# Patient Record
Sex: Female | Born: 1985 | Hispanic: Yes | Marital: Married | Smoking: Current some day smoker
Health system: Southern US, Community
[De-identification: ages and names within clinical notes are randomized; demographics above are authoritative.]

## PROBLEM LIST (undated history)

## (undated) HISTORY — PX: HAND SURGERY: SHX662

---

## 2016-12-15 ENCOUNTER — Encounter (HOSPITAL_COMMUNITY): Payer: Self-pay | Admitting: Emergency Medicine

## 2016-12-15 ENCOUNTER — Emergency Department (HOSPITAL_COMMUNITY)
Admission: EM | Admit: 2016-12-15 | Discharge: 2016-12-15 | Disposition: A | Discharge status: Home / Self Care | Payer: Self-pay | Attending: Emergency Medicine | Admitting: Emergency Medicine

## 2016-12-15 DIAGNOSIS — X58XXXA Exposure to other specified factors, initial encounter: Secondary | ICD-10-CM | POA: Insufficient documentation

## 2016-12-15 DIAGNOSIS — Y929 Unspecified place or not applicable: Secondary | ICD-10-CM | POA: Insufficient documentation

## 2016-12-15 DIAGNOSIS — Y939 Activity, unspecified: Secondary | ICD-10-CM | POA: Insufficient documentation

## 2016-12-15 DIAGNOSIS — K029 Dental caries, unspecified: Secondary | ICD-10-CM | POA: Insufficient documentation

## 2016-12-15 DIAGNOSIS — Y999 Unspecified external cause status: Secondary | ICD-10-CM | POA: Insufficient documentation

## 2016-12-15 DIAGNOSIS — S025XXA Fracture of tooth (traumatic), initial encounter for closed fracture: Secondary | ICD-10-CM | POA: Insufficient documentation

## 2016-12-15 DIAGNOSIS — K0889 Other specified disorders of teeth and supporting structures: Secondary | ICD-10-CM | POA: Insufficient documentation

## 2016-12-15 MED ORDER — OXYCODONE-ACETAMINOPHEN 5-325 MG PO TABS
1.0000 | ORAL_TABLET | Freq: Once | ORAL | Status: AC
  Administered 2016-12-15: 1 via ORAL
  Filled 2016-12-15: qty 1

## 2016-12-15 MED ORDER — TRAMADOL HCL 50 MG PO TABS: 50.0000 mg | ORAL_TABLET | Freq: Four times a day (QID) | ORAL | 0 refills | Status: DC | PRN

## 2016-12-15 MED ORDER — PENICILLIN V POTASSIUM 500 MG PO TABS: 500.0000 mg | ORAL_TABLET | Freq: Four times a day (QID) | ORAL | 0 refills | Status: AC

## 2016-12-15 NOTE — ED Notes (Signed)
PT states understanding of care given, follow up care, and medication prescribed. PT ambulated from ED to car with a steady gait. 

## 2016-12-15 NOTE — ED Provider Notes (Signed)
[facility]   CSN: [id number] Arrival date & time: 12/15/16  2038     History   Chief Complaint Chief Complaint  Patient presents with  . Dental Pain    HPI Helen Alphalex C Gentry is a 31 y.o. female.  HPI  31 y.o. female, presents to the Emergency Department today due to dental pain x 1 week. Notes having several cracked teeth in past. No trauma. Noted hx cavities. Denies fevers. Rates pain 10/10. Sharp sensation, especially on right lower tooth. No swelling. No purulent drainage. Tylenol PRN. Pt without dentist. No other symptoms noted    History reviewed. No pertinent past medical history.  There are no active problems to display for this patient.   History reviewed. No pertinent surgical history.  OB History    No data available       Home Medications    Prior to Admission medications   Not on File    Family History No family history on file.  Social History Social History  Substance Use Topics  . Smoking status: Never Smoker  . Smokeless tobacco: Never Used  . Alcohol use No     Allergies   Motrin [ibuprofen]   Review of Systems Review of Systems ROS reviewed and all are negative for acute change except as noted in the HPI.  Physical Exam Updated Vital Signs BP 124/65 (BP Location: Right Arm)   Pulse 84   Temp 98.1 F (36.7 C) (Oral)   Resp 14   Ht 5\' 6"  (1.676 m)   Wt 52.2 kg (115 lb)   LMP 12/08/2016 (Approximate)   SpO2 98%   BMI 18.56 kg/m   Physical Exam  Constitutional: She is oriented to person, place, and time. Vital signs are normal. She appears well-developed and well-nourished.  HENT:  Head: Normocephalic and atraumatic.  Right Ear: Hearing normal.  Left Ear: Hearing normal.  No trismus. No dental abscess or swelling. No palpable fluctuance. Broken tooth noted. No purulence or swelling. Several dentition with cavities   Eyes: Pupils are equal, round, and reactive to light.  Conjunctivae and EOM are normal.  Neck: Normal range of motion. Neck supple.  Cardiovascular: Normal rate, regular rhythm, normal heart sounds and intact distal pulses.   Pulmonary/Chest: Effort normal and breath sounds normal.  Musculoskeletal: Normal range of motion.  Neurological: She is alert and oriented to person, place, and time.  Skin: Skin is warm and dry.  Psychiatric: She has a normal mood and affect. Her speech is normal and behavior is normal. Thought content normal.  Nursing note and vitals reviewed.    ED Treatments / Results  Labs (all labs ordered are listed, but only abnormal results are displayed) Labs Reviewed - No data to display  EKG  EKG Interpretation None       Radiology No results found.  Procedures Procedures (including critical care time)  Medications Ordered in ED Medications - No data to display   Initial Impression / Assessment and Plan / ED Course  I have reviewed the triage vital signs and the nursing notes.  Pertinent labs & imaging results that were available during my care of the patient were reviewed by me and considered in my medical decision making (see chart for details).  Final Clinical Impressions(s) / ED Diagnoses     {I have reviewed the relevant previous healthcare records.  {I obtained HPI from historian.   ED Course:  Assessment: Dental pain associated with dental cary  but no signs or symptoms of dental abscess with patient afebrile, non toxic appearing and swallowing secretions well. Exam unconcerning for Ludwig's angina or other deep tissue infection in neck. No facial swelling or gum findings. Will treat with pain medication.  I gave patient referral to dentist and stressed the importance of dental follow up for ultimate management of dental pain. Patient voices understanding and is agreeable to plan  Disposition/Plan:  DC Home Additional Verbal discharge instructions given and discussed with patient.  Pt Instructed  to f/u with Dentist in the next week for evaluation and treatment of symptoms. Return precautions given Pt acknowledges and agrees with plan  Supervising Physician Cardama, Amadeo Garnet, *  Final diagnoses:  Pain, dental  Closed fracture of tooth, initial encounter  Dental caries    New Prescriptions New Prescriptions   No medications on file     Audry Pili, Cordelia Poche 12/15/16 2111    Nira Conn, MD 12/15/16 2352

## 2016-12-15 NOTE — Discharge Instructions (Signed)
Please read and follow all provided instructions.  Your diagnoses today include:  1. Pain, dental   2. Closed fracture of tooth, initial encounter   3. Dental caries     Tests performed today include: Vital signs. See below for your results today.   Medications prescribed:  Take as prescribed   Home care instructions:  Follow any educational materials contained in this packet.  Follow-up instructions: Please follow-up with a Dentist for further evaluation of symptoms and treatment   Return instructions:  Please return to the Emergency Department if you do not get better, if you get worse, or new symptoms OR  - Fever (temperature greater than 101.8F)  - Bleeding that does not stop with holding pressure to the area    -Severe pain (please note that you may be more sore the day after your accident)  - Chest Pain  - Difficulty breathing  - Severe nausea or vomiting  - Inability to tolerate food and liquids  - Passing out  - Skin becoming red around your wounds  - Change in mental status (confusion or lethargy)  - New numbness or weakness    Please return if you have any other emergent concerns.  Additional Information:  Your vital signs today were: BP 124/65 (BP Location: Right Arm)    Pulse 84    Temp 98.1 F (36.7 C) (Oral)    Resp 14    Ht 5\' 6"  (1.676 m)    Wt 52.2 kg (115 lb)    LMP 12/08/2016 (Approximate)    SpO2 98%    BMI 18.56 kg/m  If your blood pressure (BP) was elevated above 135/85 this visit, please have this repeated by your doctor within one month. ---------------

## 2016-12-15 NOTE — ED Triage Notes (Signed)
Pt reports dental pain X1 week, pt states she has several cracked teeth. Reports taking X2 Tylenol 3 w/o past hr

## 2016-12-15 NOTE — ED Notes (Signed)
Patient is A&Ox4.  No signs of distress noted.  Please see providers complete history and physical exam.  

## 2017-03-07 ENCOUNTER — Encounter (HOSPITAL_COMMUNITY): Payer: Self-pay | Admitting: *Deleted

## 2017-03-07 ENCOUNTER — Emergency Department (HOSPITAL_COMMUNITY)
Admission: EM | Admit: 2017-03-07 | Discharge: 2017-03-07 | Disposition: A | Discharge status: Home / Self Care | Payer: Medicaid Other | Attending: Emergency Medicine | Admitting: Emergency Medicine

## 2017-03-07 ENCOUNTER — Other Ambulatory Visit: Payer: Self-pay

## 2017-03-07 DIAGNOSIS — Z5321 Procedure and treatment not carried out due to patient leaving prior to being seen by health care provider: Secondary | ICD-10-CM | POA: Diagnosis not present

## 2017-03-07 DIAGNOSIS — R51 Headache: Secondary | ICD-10-CM | POA: Insufficient documentation

## 2017-03-07 NOTE — ED Triage Notes (Signed)
Pt was walking up the stairs and fell face first with her glasses on. Pt has a small lac to nose, no bleeding at present. C/o nose and facial pain

## 2017-03-19 ENCOUNTER — Encounter (HOSPITAL_COMMUNITY): Payer: Self-pay | Admitting: Emergency Medicine

## 2017-03-19 ENCOUNTER — Emergency Department (HOSPITAL_COMMUNITY)
Admission: EM | Admit: 2017-03-19 | Discharge: 2017-03-19 | Disposition: A | Discharge status: Home / Self Care | Payer: Medicaid Other | Attending: Emergency Medicine | Admitting: Emergency Medicine

## 2017-03-19 ENCOUNTER — Emergency Department (HOSPITAL_COMMUNITY): Payer: Medicaid Other

## 2017-03-19 DIAGNOSIS — S6991XA Unspecified injury of right wrist, hand and finger(s), initial encounter: Secondary | ICD-10-CM | POA: Diagnosis present

## 2017-03-19 DIAGNOSIS — F17210 Nicotine dependence, cigarettes, uncomplicated: Secondary | ICD-10-CM | POA: Insufficient documentation

## 2017-03-19 DIAGNOSIS — Y9389 Activity, other specified: Secondary | ICD-10-CM | POA: Insufficient documentation

## 2017-03-19 DIAGNOSIS — K0889 Other specified disorders of teeth and supporting structures: Secondary | ICD-10-CM | POA: Insufficient documentation

## 2017-03-19 DIAGNOSIS — W109XXA Fall (on) (from) unspecified stairs and steps, initial encounter: Secondary | ICD-10-CM | POA: Diagnosis not present

## 2017-03-19 DIAGNOSIS — Y999 Unspecified external cause status: Secondary | ICD-10-CM | POA: Insufficient documentation

## 2017-03-19 DIAGNOSIS — S62346A Nondisplaced fracture of base of fifth metacarpal bone, right hand, initial encounter for closed fracture: Secondary | ICD-10-CM | POA: Insufficient documentation

## 2017-03-19 DIAGNOSIS — Y929 Unspecified place or not applicable: Secondary | ICD-10-CM | POA: Diagnosis not present

## 2017-03-19 MED ORDER — CLINDAMYCIN HCL 150 MG PO CAPS: 450.0000 mg | ORAL_CAPSULE | Freq: Four times a day (QID) | ORAL | 0 refills | Status: AC

## 2017-03-19 MED ORDER — TRAMADOL HCL 50 MG PO TABS: 50.0000 mg | ORAL_TABLET | Freq: Four times a day (QID) | ORAL | 0 refills | Status: DC | PRN

## 2017-03-19 NOTE — ED Notes (Signed)
Ortho paged. 

## 2017-03-19 NOTE — ED Provider Notes (Signed)
[facility] EMERGENCY DEPARTMENT Provider Note   CSN: [id number] Arrival date & time: 03/19/17  1946     History   Chief Complaint Chief Complaint  Patient presents with  . Dental Pain  . Hand Injury    HPI Helen Gentry is a 32 y.o. female here for evaluation of right hand pain x 2 weeks. States she was walking up the stairs, tripped, and face first with her glasses on. Came to the ED the day of the fall but had to leave to take care of her children. Hand pain to the hand is worse with palpations and movement. Has taken tylenol, advil, excedrin with only temporary relief. Feels like her pinky finger is weaker. Denies numbness, tingling distally. She is RHD. Thinks she might have broken her nose as well during the fall, has mild tenderness and bump to her nose bridge, and her glasses are not fitting on her nose as they normally did. She denies LOC or nose bleed during fall.   Also reports acute on chronic dental pain. Has two broken teeth that are occasionally tender, started hurting 1 week ago again. No fevers, chills, facial swelling, trismus. Doe snot have dentist.  HPI  History reviewed. No pertinent past medical history.  There are no active problems to display for this patient.   History reviewed. No pertinent surgical history.  OB History    No data available       Home Medications    Prior to Admission medications   Medication Sig Start Date End Date Taking? Authorizing Provider  clindamycin (CLEOCIN) 150 MG capsule Take 3 capsules (450 mg total) by mouth every 6 (six) hours for 7 days. 03/19/17 03/26/17  Liberty Handy, PA-C  traMADol (ULTRAM) 50 MG tablet Take 1 tablet (50 mg total) by mouth every 6 (six) hours as needed. 03/19/17   Liberty Handy, PA-C    Family History History reviewed. No pertinent family history.  Social History Social History   Tobacco Use  . Smoking status: Current Some Day Smoker    Types: Cigarettes  . Smokeless  tobacco: Never Used  Substance Use Topics  . Alcohol use: Yes    Comment: socially  . Drug use: No     Allergies   Motrin [ibuprofen]   Review of Systems Review of Systems  HENT: Positive for dental problem.        +nose pain  Musculoskeletal: Positive for arthralgias.  Skin: Positive for color change.  All other systems reviewed and are negative.    Physical Exam Updated Vital Signs BP 106/72 (BP Location: Left Arm)   Pulse 75   Temp 99 F (37.2 C) (Oral)   Resp (!) 24   Ht 5\' 6"  (1.676 m)   Wt 52.2 kg (115 lb)   LMP 03/08/2017 (Exact Date)   SpO2 99%   BMI 18.56 kg/m   Physical Exam  Constitutional: She is oriented to person, place, and time. She appears well-developed and well-nourished. No distress.  NAD.  HENT:  Head: Normocephalic and atraumatic.  Right Ear: External ear normal.  Left Ear: External ear normal.  Nose: Nose normal.  Mouth/Throat:    Poor dentition. Mild tenderness to teeth pictured below w/o surrounding erythema, edema, warmth, fluctuance, abscess. No trismus, sublingual edema or tenderness.   Eyes: Conjunctivae and EOM are normal. No scleral icterus.  Neck: Normal range of motion. Neck supple.  Cardiovascular: Normal rate, regular rhythm and normal heart sounds.  No murmur  heard. Pulmonary/Chest: Effort normal and breath sounds normal. She has no wheezes.  Musculoskeletal: Normal range of motion. She exhibits no deformity.       Right wrist: She exhibits tenderness and bony tenderness.  Focal tenderness to right 5th metacarpal with bony prominence/deformity at the base. Decreased PROM due to pain. No focal tenderness to wrist bones, scaphoid or MCP. Full thumb opposition to all digits, pain with opposition to pinky finger. Abduction/adduction of digits against resistance intact.  Neurological: She is alert and oriented to person, place, and time.  Sensation to median, ulnar, radial nerve distribution intact bilaterally. Hand grip strength  intact bilaterally.  Skin: Skin is warm and dry. Capillary refill takes less than 2 seconds.  Ecchymosis to palmar aspect of 4th and 5th metacarpal  Psychiatric: She has a normal mood and affect. Her behavior is normal. Judgment and thought content normal.  Nursing note and vitals reviewed.    ED Treatments / Results  Labs (all labs ordered are listed, but only abnormal results are displayed) Labs Reviewed - No data to display  EKG  EKG Interpretation None       Radiology Dg Hand Complete Right  Result Date: 03/19/2017 CLINICAL DATA:  Larey Seat 1 week ago.  Fifth finger pain. EXAM: RIGHT HAND - COMPLETE 3+ VIEW COMPARISON:  None. FINDINGS: Acute mildly displaced base of fifth metacarpus fracture with intra-articular extension. Slight medially angulated distal bony fragments. No dislocation. No destructive bony lesions. Soft tissue planes are non suspicious. IMPRESSION: Acute mildly displaced base of fifth metacarpus fracture. Electronically Signed   By: Awilda Metro M.D.   On: 03/19/2017 22:32    Procedures Procedures (including critical care time)  Medications Ordered in ED Medications - No data to display   Initial Impression / Assessment and Plan / ED Course  I have reviewed the triage vital signs and the nursing notes.  Pertinent labs & imaging results that were available during my care of the patient were reviewed by me and considered in my medical decision making (see chart for details).  Clinical Course as of Mar 19 2325  Fri Mar 19, 2017  2255 FINDINGS: Acute mildly displaced base of fifth metacarpus fracture with intra-articular extension. Slight medially angulated distal bony fragments. No dislocation. No destructive bony lesions. Soft tissue planes are non suspicious. DG Hand Complete Right [CG]    Clinical Course User Index [CG] Liberty Handy, PA-C   Extremity is NVI. X-ray shows fracture of base of 5th metacarpal. Spoke to Dr Mina Marble who will see  patient in office this week, appreciate assistance in caring for this patient. Will place ulnar gutter splint and discharge with pain medications. Discussed plan with pt who verbalized understanding and agreeable with plan.   Given poor dentition, exposed dentin, will rx clindamycin for dental infection. Ecnouraged pt to f/u with dentist for routine dental care and likely extractions. No signs or symptoms of dental/oral emergency, abscess to warrant I&D today. No trismus, facial swelling, fever, sublingual tenderness or swelling. Clinical picture not consistent with dental abscess, ludwig's, PTA, deep neck or facial abscess.  Final Clinical Impressions(s) / ED Diagnoses   Final diagnoses:  Nondisplaced fracture of base of fifth metacarpal bone, right hand, initial encounter for closed fracture  Pain, dental    ED Discharge Orders        Ordered    traMADol (ULTRAM) 50 MG tablet  Every 6 hours PRN     03/19/17 2323    clindamycin (CLEOCIN) 150 MG capsule  Every 6 hours     03/19/17 2323       Jerrell MylarGibbons, Jaquasha Carnevale J, PA-C 03/19/17 [id number]    Rolan BuccoBelfi, Melanie, MD 03/19/17 [phone]

## 2017-03-19 NOTE — ED Notes (Signed)
Patient transported to X-ray 

## 2017-03-19 NOTE — ED Triage Notes (Signed)
Pt reports dental caries that have been increasingly painful today. States admits to ED today d/t increased pain and difficulty eating. Also continuing to complain of pain from fall last week - seen in our ED already for same. Taking tramadol, tylenol and excedrin for pain.

## 2017-03-19 NOTE — Discharge Instructions (Signed)
There is a fracture of your 5th metacarpal hand bone. Wear splint until you are evaluated by Dr. Mina MarbleWeingold. Call his office to make an appointment, he would like to see you Monday or Tuesday of next week.  Take tramadol for pain as prescribed. Additionally, take 1000 mg tylenol PLUS 600 mg ibuprofen every 6 hours for pain.   Take clindamycin for dental infection. See dental resource, you will continue to have dental pain and be at risk of dental infection until you are seen and treated by a dentist. You likely need extractions.

## 2017-03-19 NOTE — Progress Notes (Signed)
Orthopedic Tech Progress Note Patient Details:  Helen Gentry Sep 15, 1985 [id number]  Ortho Devices Type of Ortho Device: Ulna gutter splint, Arm sling Ortho Device/Splint Location: rue Ortho Device/Splint Interventions: Ordered, Application, Adjustment   Post Interventions Patient Tolerated: Well Instructions Provided: Care of device, Adjustment of device   Trinna PostMartinez, Helen Gayle J 03/19/2017, 11:57 PM

## 2017-06-29 ENCOUNTER — Emergency Department (HOSPITAL_COMMUNITY): Payer: Medicaid Other

## 2017-06-29 ENCOUNTER — Encounter (HOSPITAL_COMMUNITY): Payer: Self-pay | Admitting: Emergency Medicine

## 2017-06-29 ENCOUNTER — Emergency Department (HOSPITAL_COMMUNITY)
Admission: EM | Admit: 2017-06-29 | Discharge: 2017-06-29 | Disposition: A | Discharge status: Home / Self Care | Payer: Medicaid Other | Attending: Emergency Medicine | Admitting: Emergency Medicine

## 2017-06-29 ENCOUNTER — Other Ambulatory Visit: Payer: Self-pay

## 2017-06-29 DIAGNOSIS — F17210 Nicotine dependence, cigarettes, uncomplicated: Secondary | ICD-10-CM | POA: Diagnosis not present

## 2017-06-29 DIAGNOSIS — K0889 Other specified disorders of teeth and supporting structures: Secondary | ICD-10-CM | POA: Diagnosis not present

## 2017-06-29 DIAGNOSIS — R1031 Right lower quadrant pain: Secondary | ICD-10-CM | POA: Diagnosis present

## 2017-06-29 DIAGNOSIS — N83201 Unspecified ovarian cyst, right side: Secondary | ICD-10-CM | POA: Insufficient documentation

## 2017-06-29 DIAGNOSIS — M7918 Myalgia, other site: Secondary | ICD-10-CM | POA: Diagnosis not present

## 2017-06-29 LAB — WET PREP, GENITAL
Clue Cells Wet Prep HPF POC: NONE SEEN
Sperm: NONE SEEN
Trich, Wet Prep: NONE SEEN
Yeast Wet Prep HPF POC: NONE SEEN

## 2017-06-29 LAB — URINALYSIS, ROUTINE W REFLEX MICROSCOPIC
Bilirubin Urine: NEGATIVE
Glucose, UA: NEGATIVE mg/dL
Hgb urine dipstick: NEGATIVE
Ketones, ur: NEGATIVE mg/dL
Leukocytes, UA: NEGATIVE
Nitrite: NEGATIVE
Protein, ur: NEGATIVE mg/dL
Specific Gravity, Urine: 1.011 (ref 1.005–1.030)
pH: 5 (ref 5.0–8.0)

## 2017-06-29 LAB — POC URINE PREG, ED: Preg Test, Ur: NEGATIVE

## 2017-06-29 MED ORDER — TRAMADOL HCL 50 MG PO TABS: 50.0000 mg | ORAL_TABLET | Freq: Four times a day (QID) | ORAL | 0 refills | Status: AC | PRN

## 2017-06-29 MED ORDER — OXYCODONE-ACETAMINOPHEN 5-325 MG PO TABS
1.0000 | ORAL_TABLET | ORAL | Status: DC | PRN
  Administered 2017-06-29: 1 via ORAL
  Filled 2017-06-29: qty 1

## 2017-06-29 MED ORDER — HYDROCODONE-ACETAMINOPHEN 5-325 MG PO TABS
1.0000 | ORAL_TABLET | Freq: Once | ORAL | Status: AC
  Administered 2017-06-29: 1 via ORAL
  Filled 2017-06-29: qty 1

## 2017-06-29 NOTE — ED Provider Notes (Signed)
[facility] EMERGENCY DEPARTMENT Provider Note   CSN: [id number] Arrival date & time: 06/29/17  3295     History   Chief Complaint Chief Complaint  Patient presents with  . Dental Pain  . Pelvic Pain  . Hand Pain    HPI Helen Gentry is a 32 y.o. female.  HPI   32 year old female presents today with several complaints.  Patient reports when history of right hand pain, she notes this is throughout her fingers, and feels if "my bones hurt".  She reports swelling this morning, none presently.  She denies any fever warmth or recent trauma.  She does note a remote history of metacarpal fracture.  She also notes 1 week of pelvic pain, she notes this is in her right lower pelvis, she notes history of ovarian cyst that felt similar in the past.  She denies any vaginal discharge or bleeding.  She denies any acute onset, reports Ultram was dividing some minor relief but she ran out of this medication.  She also notes she was using Tylenol at home.  LMP April 21 and lasted 2 weeks which was unusual for her.  Patient also notes pain to the right lower second molar as she has a fracture at that location-she denies any swelling to the face or mouth.    No past medical history on file.  There are no active problems to display for this patient.   Past Surgical History:  Procedure Laterality Date  . HAND SURGERY       OB History   None      Home Medications    Prior to Admission medications   Medication Sig Start Date End Date Taking? Authorizing Provider  traMADol (ULTRAM) 50 MG tablet Take 1 tablet (50 mg total) by mouth every 6 (six) hours as needed. 06/29/17   Eyvonne Mechanic, PA-C    Family History No family history on file.  Social History Social History   Tobacco Use  . Smoking status: Current Some Day Smoker    Types: Cigarettes  . Smokeless tobacco: Never Used  Substance Use Topics  . Alcohol use: Yes    Comment: socially  . Drug use: No     Allergies   Motrin [ibuprofen]   Review of Systems Review of Systems  All other systems reviewed and are negative.   Physical Exam Updated Vital Signs BP 106/65 (BP Location: Right Arm)   Pulse 60   Temp 98.8 F (37.1 C) (Oral)   Resp 16   LMP 06/06/2017   SpO2 98%   Physical Exam  Constitutional: She is oriented to person, place, and time. She appears well-developed and well-nourished.  HENT:  Head: Normocephalic and atraumatic.  Eyes: Pupils are equal, round, and reactive to light. Conjunctivae are normal. Right eye exhibits no discharge. Left eye exhibits no discharge. No scleral icterus.  Neck: Normal range of motion. No JVD present. No tracheal deviation present.  Pulmonary/Chest: Effort normal. No stridor.  Abdominal:  Minor tenderness palpation of the right lower and mid pelvis, no abdominal tenderness to palpation  Genitourinary:  Genitourinary Comments: Vaginal exam shows no vaginal discharge or bleeding, no significant cervical motion tenderness, adnexal tenderness or masses  Neurological: She is alert and oriented to person, place, and time. Coordination normal.  Psychiatric: She has a normal mood and affect. Her behavior is normal. Judgment and thought content normal.  Nursing note and vitals reviewed.    ED Treatments / Results  Labs (all labs  ordered are listed, but only abnormal results are displayed) Labs Reviewed  WET PREP, GENITAL - Abnormal; Notable for the following components:      Result Value   WBC, Wet Prep HPF POC MANY (*)    All other components within normal limits  URINALYSIS, ROUTINE W REFLEX MICROSCOPIC - Abnormal; Notable for the following components:   Color, Urine STRAW (*)    All other components within normal limits  POC URINE PREG, ED  GC/CHLAMYDIA PROBE AMP ([place]) NOT AT [facility]    EKG None  Radiology US Transvaginal Non-ob  Result Date: 06/29/2017 CLINICAL DATA:  One week history of pelvic pain EXAM:  TRANSABDOMINAL AND TRANSVAGINAL ULTRASOUND OF PELVIS DOPPLER ULTRASOUND OF OVARIES TECHNIQUE: Study was performed transabdominally to optimize pelvic field of view evaluation and transvaginally to optimize internal visceral architecture evaluation. Color and duplex Doppler ultrasound was utilized to evaluate blood flow to the ovaries. COMPARISON:  None. FINDINGS: Uterus Measurements: 8.8 x 5.0 x 6.9 cm. No fibroids or other mass visualized. Endometrium Thickness: 11 mm. An intrauterine device is positioned within the endometrium. No focal abnormality visualized. Right ovary Measurements: 3.4 x 1.9 x 3.0 cm. There is a complex partially cystic mass within the right ovary measuring 2.2 x 2.0 x 2.1 cm. No other extrauterine lesions seen on the right. Left ovary Measurements: 3.3 x 1.7 x 2.4 cm. Normal appearance/no adnexal mass. Pulsed Doppler evaluation of both ovaries demonstrates normal low-resistance arterial and venous waveforms. Other findings No abnormal free fluid. IMPRESSION: 1. Complex mass within the right ovary measuring 2.2 x 2.0 x 2.1 cm. This mass most likely represents a hemorrhagic ovarian cyst. Short-interval follow up ultrasound in 6-12 weeks is recommended, preferably during the week following the patient's normal menses. 2. Intrauterine device positioned within the endometrium. The endometrium does not appear appreciably thickened. The contour of the endometrium is smooth. No intrauterine mass evident. 3. No left-sided ovarian mass beyond physiologic small follicles. No appreciable free pelvic fluid. 4.  No demonstrable ovarian torsion on either side. Electronically Signed   By: Bretta Bang III M.D.   On: 06/29/2017 14:33   US Pelvis Complete  Result Date: 06/29/2017 CLINICAL DATA:  One week history of pelvic pain EXAM: TRANSABDOMINAL AND TRANSVAGINAL ULTRASOUND OF PELVIS DOPPLER ULTRASOUND OF OVARIES TECHNIQUE: Study was performed transabdominally to optimize pelvic field of view  evaluation and transvaginally to optimize internal visceral architecture evaluation. Color and duplex Doppler ultrasound was utilized to evaluate blood flow to the ovaries. COMPARISON:  None. FINDINGS: Uterus Measurements: 8.8 x 5.0 x 6.9 cm. No fibroids or other mass visualized. Endometrium Thickness: 11 mm. An intrauterine device is positioned within the endometrium. No focal abnormality visualized. Right ovary Measurements: 3.4 x 1.9 x 3.0 cm. There is a complex partially cystic mass within the right ovary measuring 2.2 x 2.0 x 2.1 cm. No other extrauterine lesions seen on the right. Left ovary Measurements: 3.3 x 1.7 x 2.4 cm. Normal appearance/no adnexal mass. Pulsed Doppler evaluation of both ovaries demonstrates normal low-resistance arterial and venous waveforms. Other findings No abnormal free fluid. IMPRESSION: 1. Complex mass within the right ovary measuring 2.2 x 2.0 x 2.1 cm. This mass most likely represents a hemorrhagic ovarian cyst. Short-interval follow up ultrasound in 6-12 weeks is recommended, preferably during the week following the patient's normal menses. 2. Intrauterine device positioned within the endometrium. The endometrium does not appear appreciably thickened. The contour of the endometrium is smooth. No intrauterine mass evident. 3. No left-sided ovarian  mass beyond physiologic small follicles. No appreciable free pelvic fluid. 4.  No demonstrable ovarian torsion on either side. Electronically Signed   By: Bretta Bang III M.D.   On: 06/29/2017 14:33   [place] Art/ven Flow Abd Pelv Doppler  Result Date: 06/29/2017 CLINICAL DATA:  One week history of pelvic pain EXAM: TRANSABDOMINAL AND TRANSVAGINAL ULTRASOUND OF PELVIS DOPPLER ULTRASOUND OF OVARIES TECHNIQUE: Study was performed transabdominally to optimize pelvic field of view evaluation and transvaginally to optimize internal visceral architecture evaluation. Color and duplex Doppler ultrasound was utilized to evaluate blood flow  to the ovaries. COMPARISON:  None. FINDINGS: Uterus Measurements: 8.8 x 5.0 x 6.9 cm. No fibroids or other mass visualized. Endometrium Thickness: 11 mm. An intrauterine device is positioned within the endometrium. No focal abnormality visualized. Right ovary Measurements: 3.4 x 1.9 x 3.0 cm. There is a complex partially cystic mass within the right ovary measuring 2.2 x 2.0 x 2.1 cm. No other extrauterine lesions seen on the right. Left ovary Measurements: 3.3 x 1.7 x 2.4 cm. Normal appearance/no adnexal mass. Pulsed Doppler evaluation of both ovaries demonstrates normal low-resistance arterial and venous waveforms. Other findings No abnormal free fluid. IMPRESSION: 1. Complex mass within the right ovary measuring 2.2 x 2.0 x 2.1 cm. This mass most likely represents a hemorrhagic ovarian cyst. Short-interval follow up ultrasound in 6-12 weeks is recommended, preferably during the week following the patient's normal menses. 2. Intrauterine device positioned within the endometrium. The endometrium does not appear appreciably thickened. The contour of the endometrium is smooth. No intrauterine mass evident. 3. No left-sided ovarian mass beyond physiologic small follicles. No appreciable free pelvic fluid. 4.  No demonstrable ovarian torsion on either side. Electronically Signed   By: Bretta Bang III M.D.   On: 06/29/2017 14:33   Dg Hand Complete Right  Result Date: 06/29/2017 CLINICAL DATA:  Right hand pain for the past week.  No injury. EXAM: RIGHT HAND - COMPLETE 3+ VIEW COMPARISON:  Right hand x-rays dated March 19, 2017. FINDINGS: No acute fracture or dislocation. Essentially healed fracture of the base of the fifth metacarpal. Joint spaces are preserved. Bone mineralization is normal. Soft tissues are unremarkable. IMPRESSION: Negative. Electronically Signed   By: Obie Dredge M.D.   On: 06/29/2017 07:48    Procedures Procedures (including critical care time)  Medications Ordered in  ED Medications  oxyCODONE-acetaminophen (PERCOCET/ROXICET) 5-325 MG per tablet 1 tablet (1 tablet Oral Given 06/29/17 0719)  HYDROcodone-acetaminophen (NORCO/VICODIN) 5-325 MG per tablet 1 tablet (1 tablet Oral Given 06/29/17 1239)     Initial Impression / Assessment and Plan / ED Course  I have reviewed the triage vital signs and the nursing notes.  Pertinent labs & imaging results that were available during my care of the patient were reviewed by me and considered in my medical decision making (see chart for details).    Labs: Point-of-care urine prior, urinalysis, wet prep  Imaging: Ultrasound pelvis complete  Consults:  Therapeutics:  Discharge Meds:   Assessment/Plan:   32 year old female presents today with numerous complaints.  Patient has what is likely ovarian cyst, ultrasound recommended in 6 to 12 weeks for repeat imaging.  Patient made aware of this and will follow-up as an outpatient for repeat imaging.  Patient also having dental pain this appears to be uncomplicated referred to the dentist for follow-up.  Patient also having pain in her hand uncertain etiology no signs of infection or trauma.  Patient is given strict return precautions, she verbalized  understanding and agreement to today's plan had no further questions or concerns at the time discharge  Final Clinical Impressions(s) / ED Diagnoses   Final diagnoses:  Pain, dental  Cyst of right ovary  Musculoskeletal pain    ED Discharge Orders        Ordered    traMADol (ULTRAM) 50 MG tablet  Every 6 hours PRN     06/29/17 1511       Eyvonne Mechanic, PA-C 06/29/17 1521    Rolland Porter, MD 07/05/17 1530

## 2017-06-29 NOTE — Discharge Instructions (Addendum)
Please read attached information. If you experience any new or worsening signs or symptoms please return to the emergency room for evaluation. Please follow-up with your primary care provider or specialist as discussed. Please use medication prescribed only as directed and discontinue taking if you have any concerning signs or symptoms.  Please inform your OB/GYN or primary care about ultrasound findings and need for follow-up imaging as noted.

## 2017-06-29 NOTE — ED Triage Notes (Signed)
Pt has multiple complaints. Pt states right lower dental pain since last night. Pt states pelvic pain since last week, denies vaginal bleeding or discharge. Pt has an IUD that shes had for 1 year. Pt also c.o. Right hand pain for 1 week, denies injury but does have rods in the hand from a previous injury and states it hurts to move her fingers.

## 2017-06-30 LAB — GC/CHLAMYDIA PROBE AMP (~~LOC~~) NOT AT ARMC
Chlamydia: NEGATIVE
Neisseria Gonorrhea: NEGATIVE

## 2018-07-22 IMAGING — DX DG HAND COMPLETE 3+V*R*
4 series · 4 of 4 positions shown · non-contrast
Comparison: None.

CLINICAL DATA: Fell 1 week ago.  Fifth finger pain.

EXAM:
RIGHT HAND - COMPLETE 3+ VIEW

[hand pa]
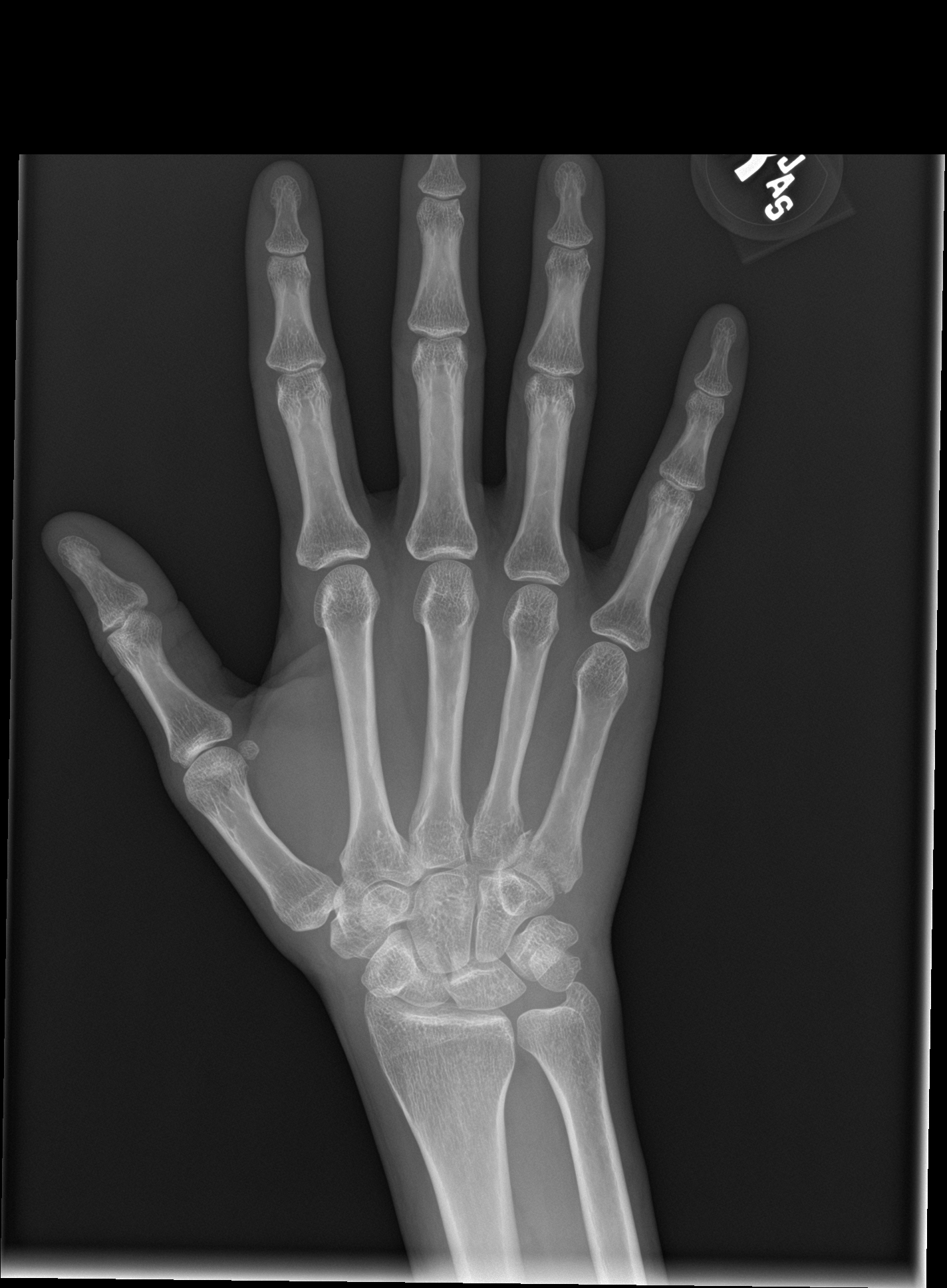

[hand obl]
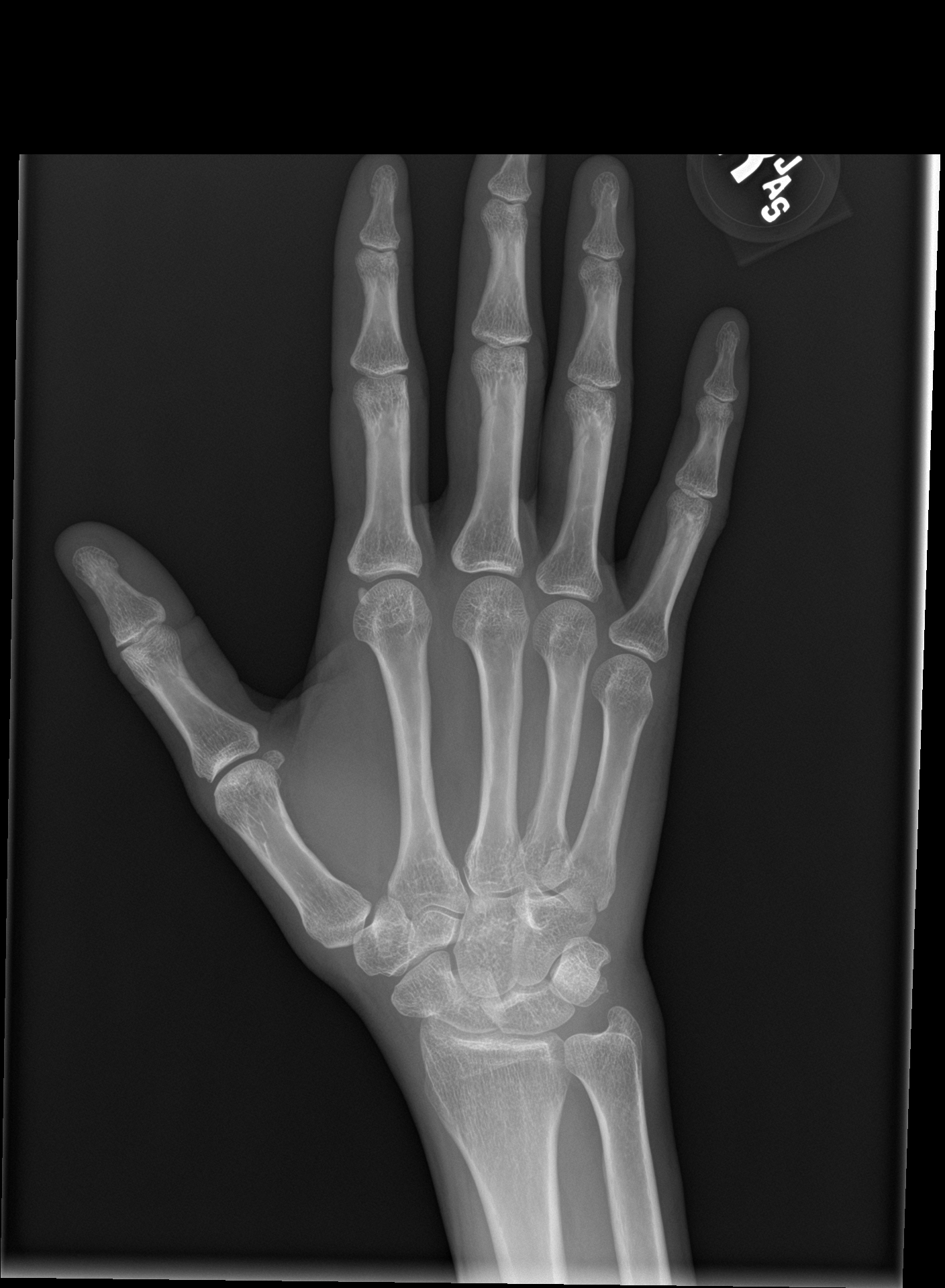

[hand lat (1 of 2)]
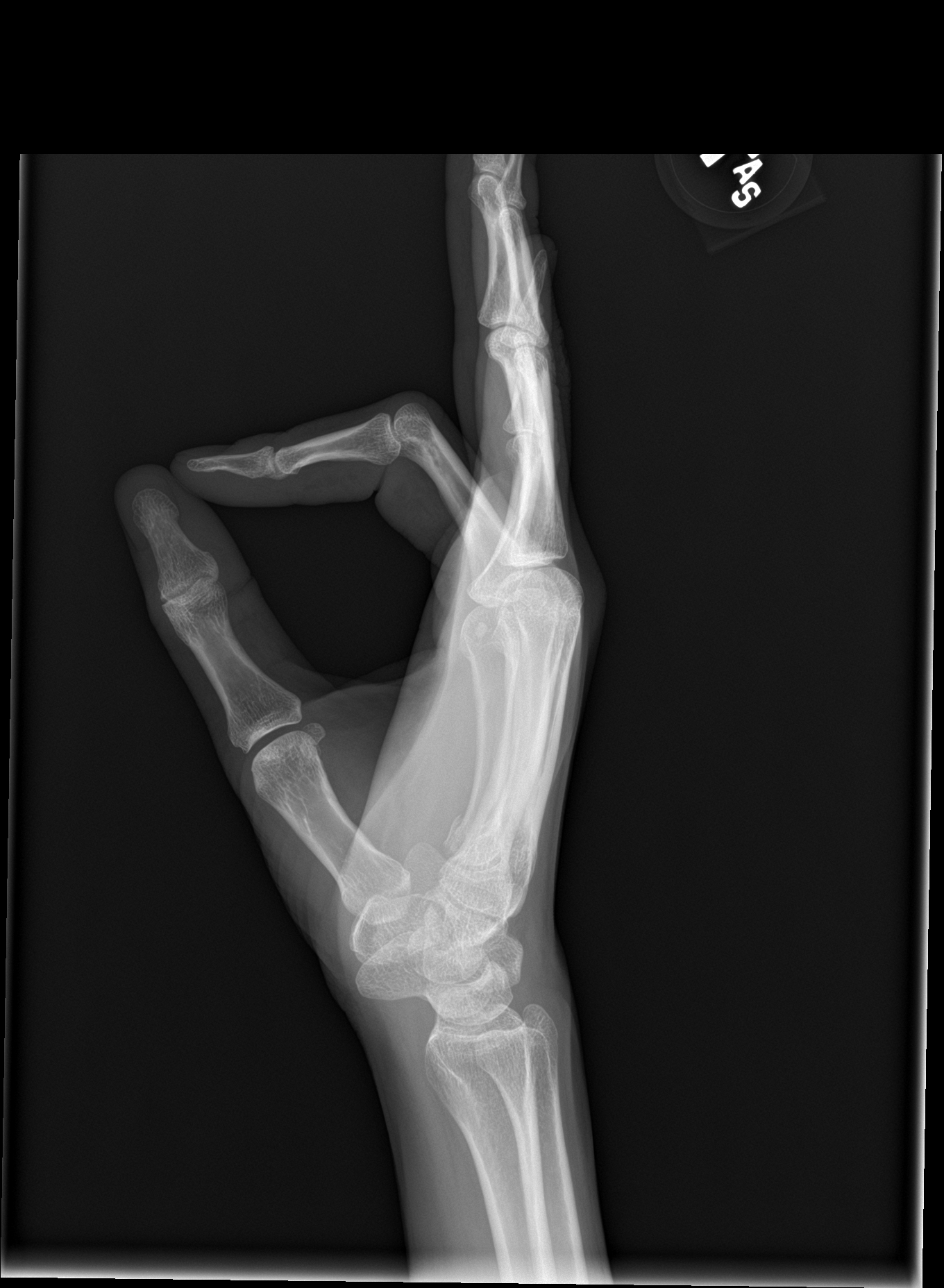

[hand lat (2 of 2)]
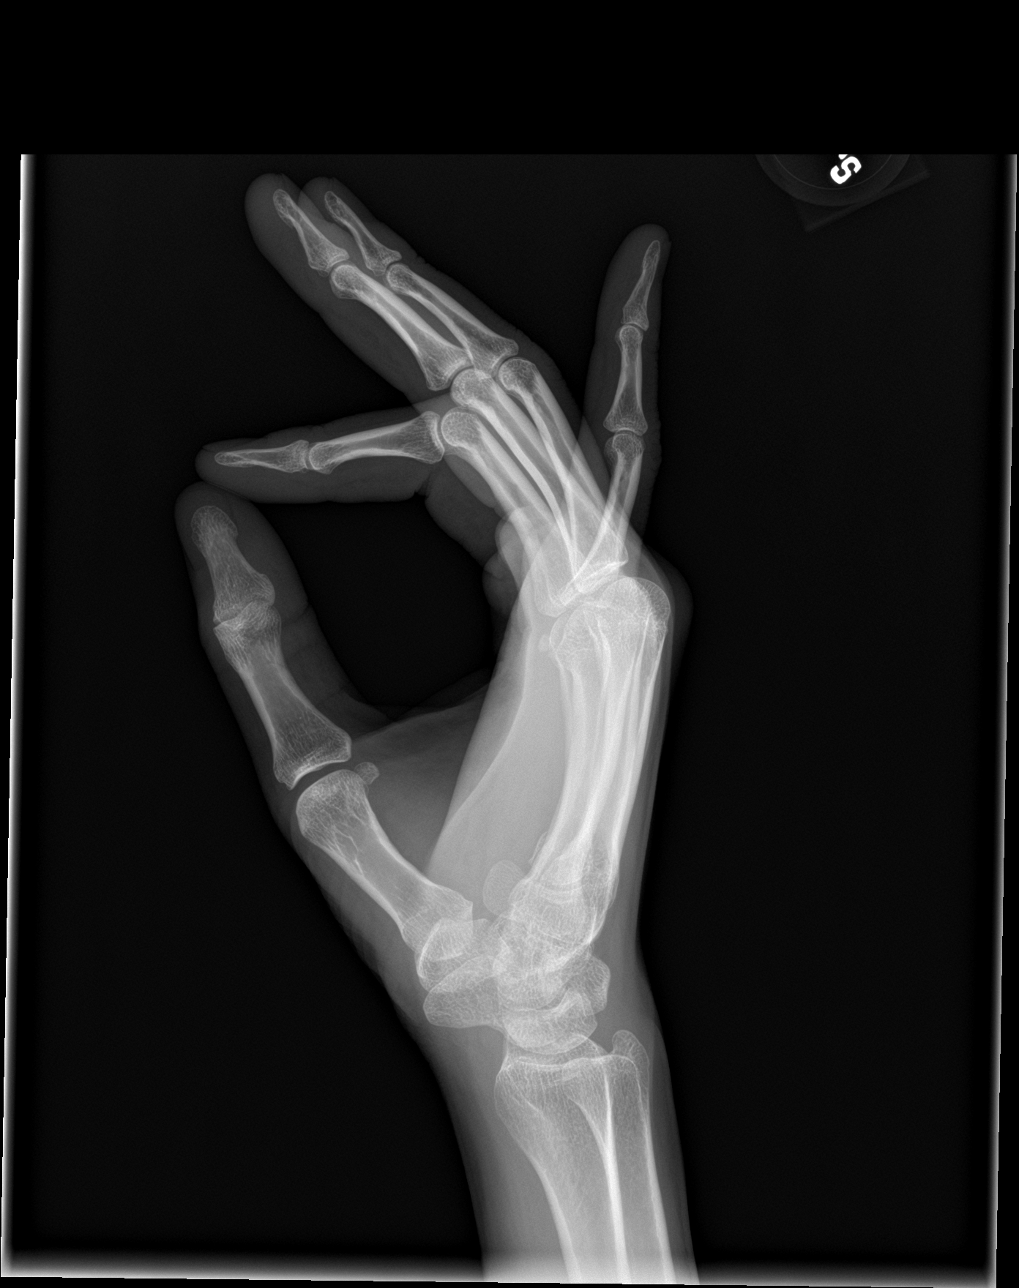

[4 of 4 positions shown; findings below may reference images not displayed]

FINDINGS: Acute mildly displaced base of fifth metacarpus fracture with
intra-articular extension. Slight medially angulated distal bony
fragments. No dislocation. No destructive bony lesions. Soft tissue
planes are non suspicious.
IMPRESSION: Acute mildly displaced base of fifth metacarpus fracture.

## 2018-10-10 IMAGING — US US TRANSVAGINAL NON-OB
1 series · 13 of 25 positions shown · non-contrast
Comparison: None.

CLINICAL DATA: One week history of pelvic pain

EXAM:
TRANSABDOMINAL AND TRANSVAGINAL ULTRASOUND OF PELVIS
DOPPLER ULTRASOUND OF OVARIES
TECHNIQUE: Study was performed transabdominally to optimize pelvic field of
view evaluation and transvaginally to optimize internal visceral
architecture evaluation. Color and duplex Doppler ultrasound was
utilized to evaluate blood flow to the ovaries.

[Series 1: us transvaginal non-ob · 0.21mm/px · 112 acquisitions, 13 frames shown]
[im 1/112]
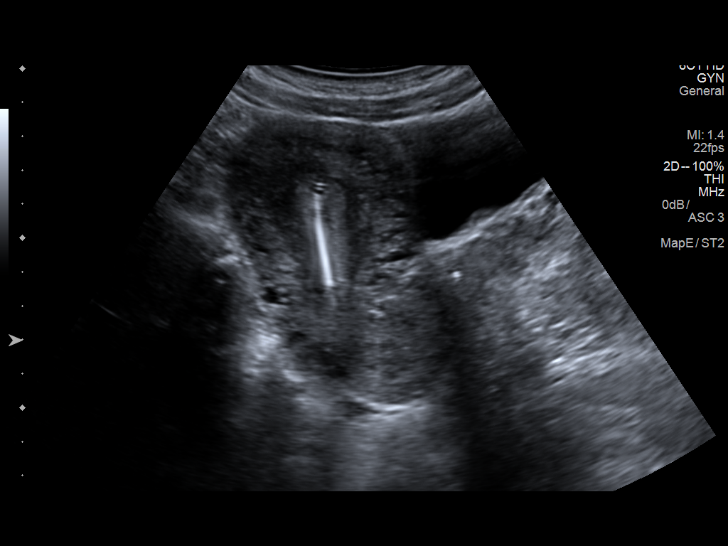
[im 10/112]
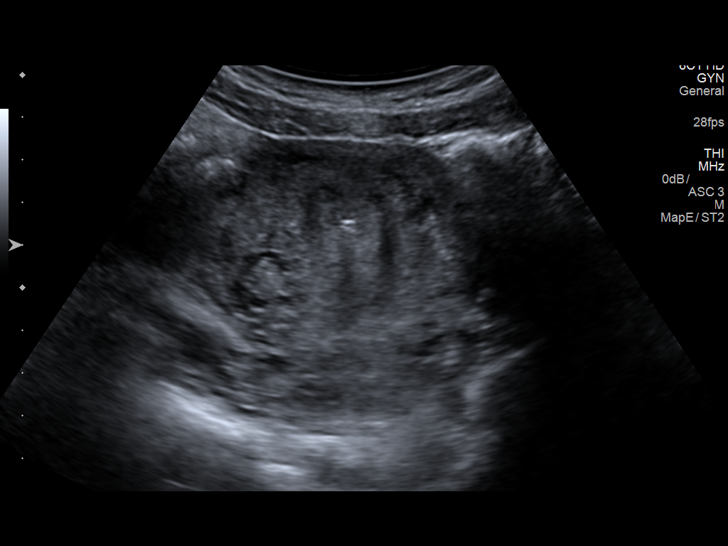
[im 19/112]
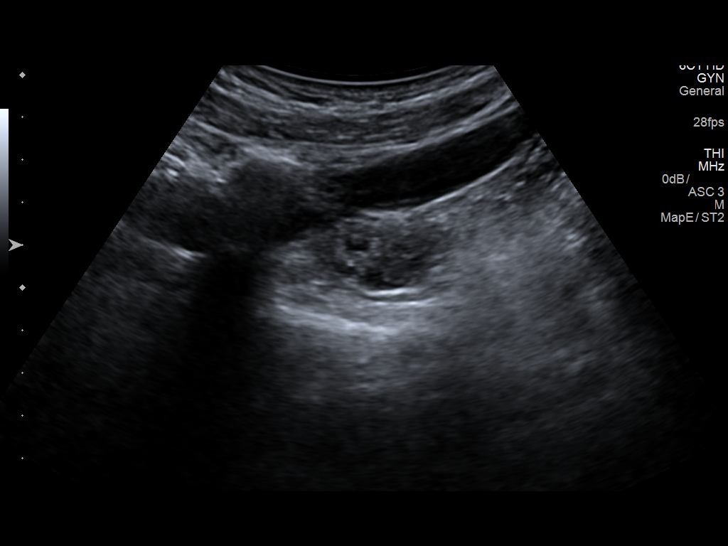
[im 28/112]
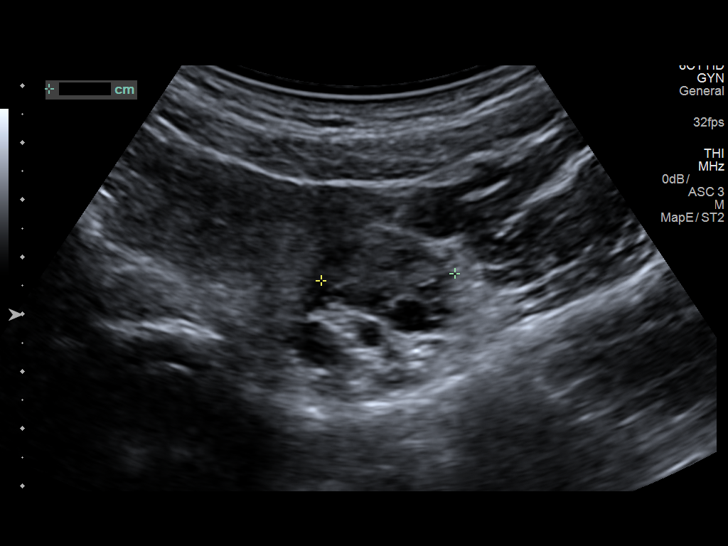
[im 38/112]
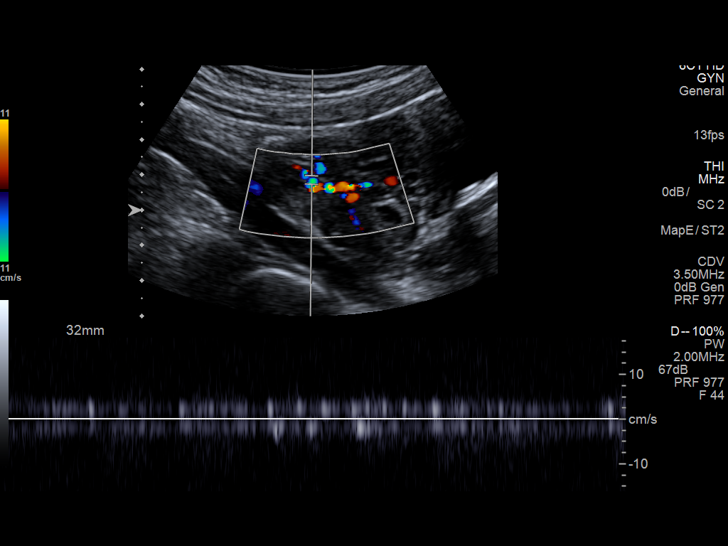
[im 47/112]
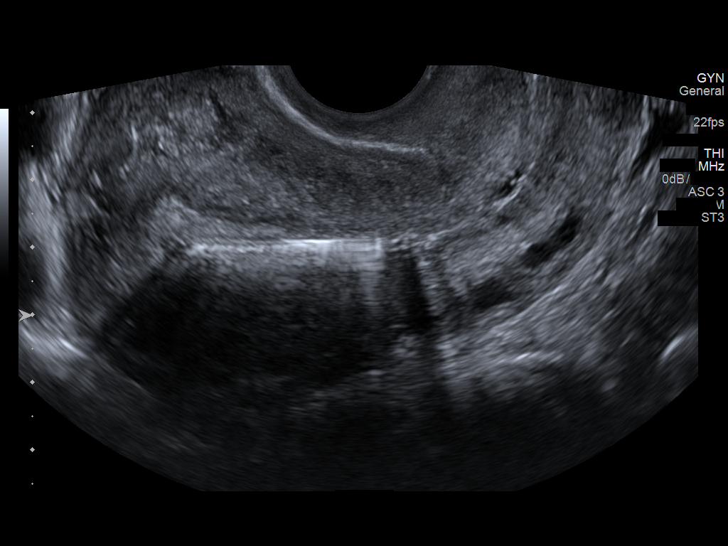
[im 56/112]
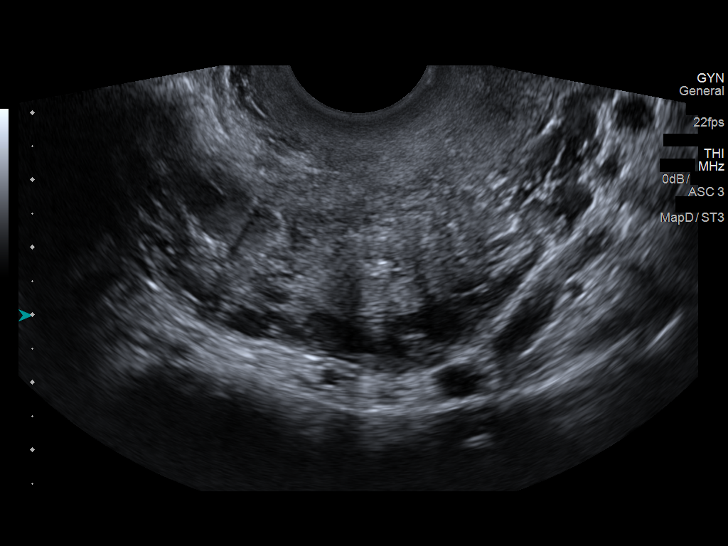
[im 65/112]
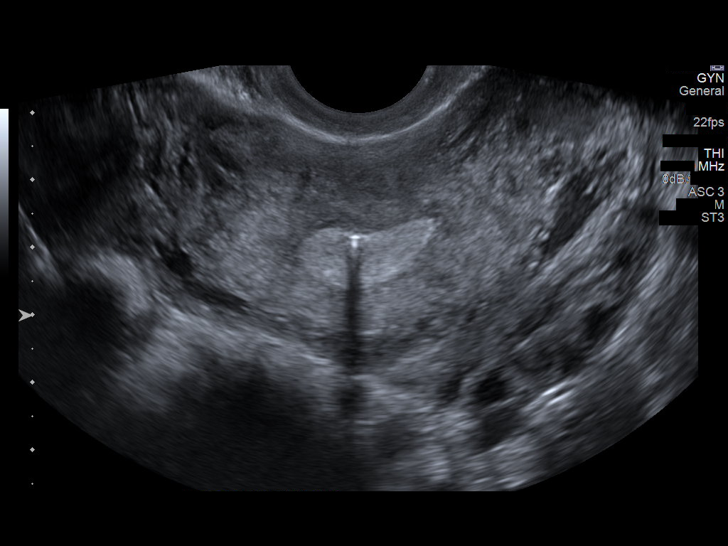
[im 75/112]
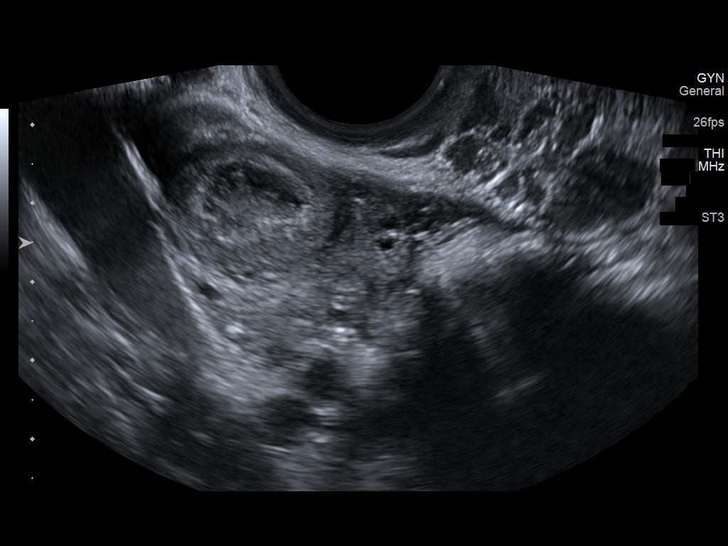
[im 84/112]
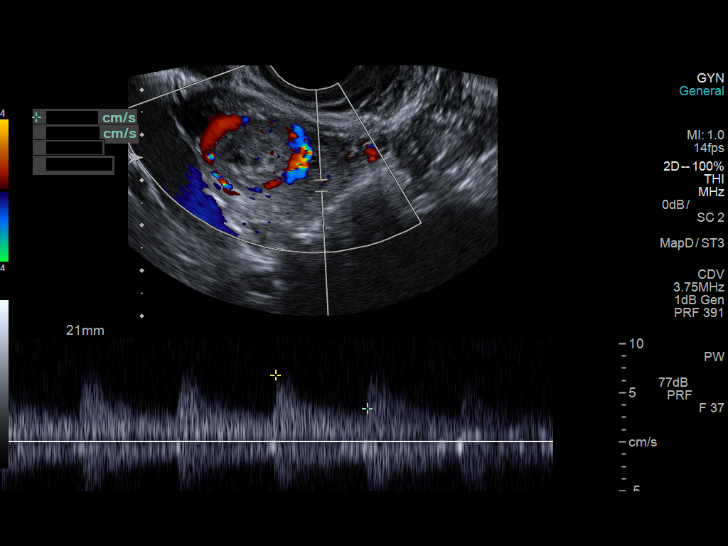
[im 93/112]
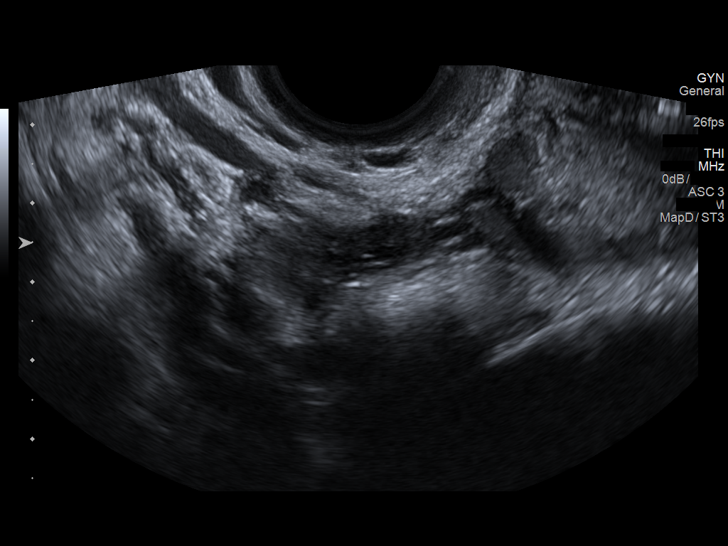
[im 102/112]
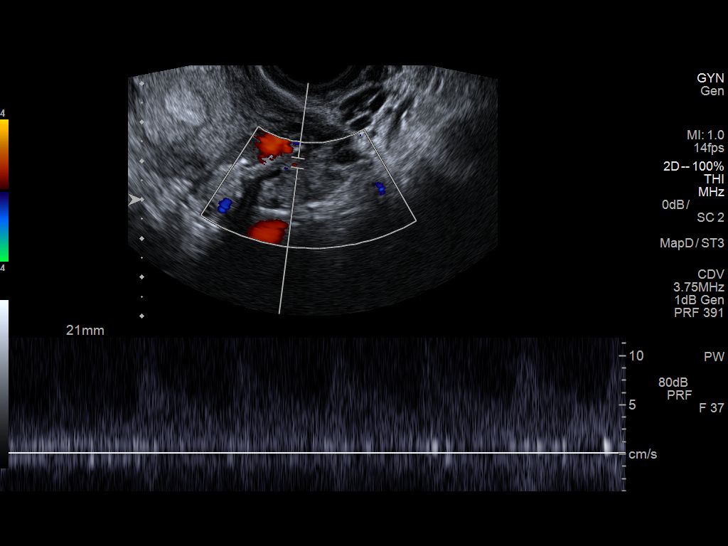
[im 112/112]
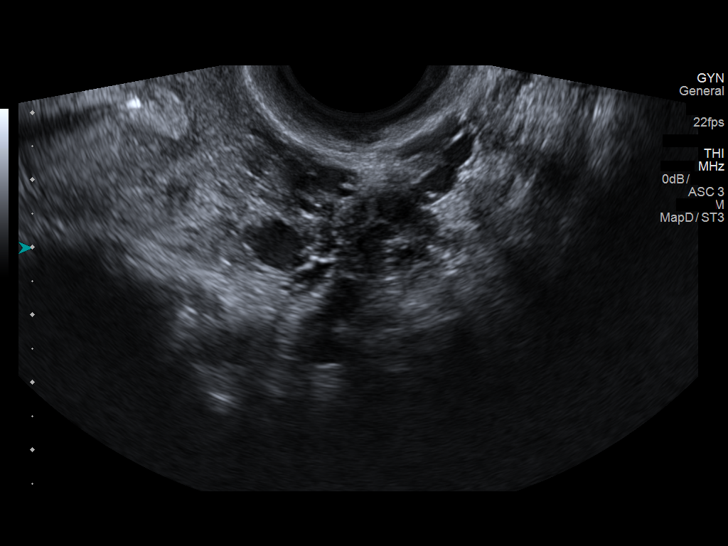

[13 of 25 positions shown; findings below may reference images not displayed]

FINDINGS: Uterus

Measurements: 8.8 x 5.0 x 6.9 cm. No fibroids or other mass
visualized.

Endometrium

Thickness: 11 mm. An intrauterine device is positioned within the
endometrium. No focal abnormality visualized.

Right ovary

Measurements: 3.4 x 1.9 x 3.0 cm. There is a complex partially
cystic mass within the right ovary measuring 2.2 x 2.0 x 2.1 cm. No
other extrauterine lesions seen on the right.

Left ovary

Measurements: 3.3 x 1.7 x 2.4 cm. Normal appearance/no adnexal mass.

Pulsed Doppler evaluation of both ovaries demonstrates normal
low-resistance arterial and venous waveforms.

Other findings

No abnormal free fluid.
IMPRESSION: 1. Complex mass within the right ovary measuring 2.2 x 2.0 x 2.1 cm.
This mass most likely represents a hemorrhagic ovarian cyst.
Short-interval follow up ultrasound in 6-12 weeks is recommended,
preferably during the week following the patient's normal menses.

2. Intrauterine device positioned within the endometrium. The
endometrium does not appear appreciably thickened. The contour of
the endometrium is smooth. No intrauterine mass evident.

3. No left-sided ovarian mass beyond physiologic small follicles. No
appreciable free pelvic fluid.

4.  No demonstrable ovarian torsion on either side.
